# Patient Record
Sex: Male | Born: 1950 | Race: Black or African American | Hispanic: No | Smoking: Former smoker
Health system: Southern US, Community
[De-identification: ages and names within clinical notes are randomized; demographics above are authoritative.]

## PROBLEM LIST (undated history)

## (undated) DIAGNOSIS — I10 Essential (primary) hypertension: Secondary | ICD-10-CM

## (undated) DIAGNOSIS — E669 Obesity, unspecified: Secondary | ICD-10-CM

---

## 2003-12-16 ENCOUNTER — Emergency Department: Payer: Self-pay | Admitting: Emergency Medicine

## 2012-04-10 ENCOUNTER — Ambulatory Visit: Payer: Self-pay | Admitting: Family Medicine

## 2012-04-10 LAB — RAPID INFLUENZA A&B ANTIGENS

## 2013-03-04 ENCOUNTER — Ambulatory Visit: Payer: Self-pay

## 2013-09-16 ENCOUNTER — Ambulatory Visit: Payer: Self-pay | Admitting: Emergency Medicine

## 2015-11-13 ENCOUNTER — Ambulatory Visit
Admission: EM | Admit: 2015-11-13 | Discharge: 2015-11-13 | Disposition: A | Discharge status: Home / Self Care | Payer: BC Managed Care – PPO | Attending: Family Medicine | Admitting: Family Medicine

## 2015-11-13 ENCOUNTER — Encounter: Payer: Self-pay | Admitting: *Deleted

## 2015-11-13 ENCOUNTER — Ambulatory Visit (INDEPENDENT_AMBULATORY_CARE_PROVIDER_SITE_OTHER): Payer: BC Managed Care – PPO

## 2015-11-13 DIAGNOSIS — S93402A Sprain of unspecified ligament of left ankle, initial encounter: Secondary | ICD-10-CM | POA: Diagnosis not present

## 2015-11-13 HISTORY — DX: Essential (primary) hypertension: I10

## 2015-11-13 HISTORY — DX: Obesity, unspecified: E66.9

## 2015-11-13 NOTE — ED Provider Notes (Signed)
[facility] ____________________________________________  Time seen: Approximately 11:34 AM  I have reviewed the triage vital signs and the nursing notes.   HISTORY  Chief Complaint Ankle Pain   HPI Timothy Gutierrez. is a 65 y.o. male presents for the complaints of left medial ankle pain. Patient reports 3 days ago while he was at the gym doing a weighted calf raise exercise he believes he hurt his left ankle. Patient reports that in this exercise he quickly increased the weight resistance and did not gradually increased. Patient states he had minimal discomfort at that time but reports as the night progressed into the next day he then had onset of pain. Patient reports that since this time he has applied ice, elevated and rested it. Patient states pain is dramatically improved. Patient states this time he has minimal left medial ankle pain, and further stated he contemplated coming into the urgent care today. Patient states however he wanted to make sure he did not have a bone abnormality,so that he could get back to exercising and working out at the gym.  Patient reports that he has increased his overall exercise regimen in the last month with approximately 20 pound weight loss. 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 Denies any fall to the ground, direct trauma, head injury or loss of conscious. Denies any numbness or tingling sensation. Denies any pain radiation, other extremity pain or other complaints. Patient reports feels well otherwise. Denies any difficulty walking except for he states that he feels like he is walking more on the outer portion of his left heel to compensate.    Past Medical History:  Diagnosis Date  . Hypertension   . Obesity     There are no active problems to display for this patient.   History reviewed. No pertinent surgical history.  Current Outpatient Rx  . Order #: [id number] Class: Historical Med  . Order #: [id number] Class:  Historical Med    No current facility-administered medications for this encounter.   Current Outpatient Prescriptions:  .  AMLODIPINE BESYLATE PO, Take by mouth., Disp: , Rfl:  .  hydrochlorothiazide (HYDRODIURIL) 25 MG tablet, Take 25 mg by mouth daily., Disp: , Rfl:   Allergies Review of patient's allergies indicates no known allergies.  History reviewed. No pertinent family history.  Social History Social History  Substance Use Topics  . Smoking status: Former Games developer  . Smokeless tobacco: Never Used  . Alcohol use No    Review of Systems Constitutional: No fever/chills Eyes: No visual changes. ENT: No sore throat. Cardiovascular: Denies chest pain. Respiratory: Denies shortness of breath. Gastrointestinal: No abdominal pain.  No nausea, no vomiting.  No diarrhea.  No constipation. Genitourinary: Negative for dysuria. Musculoskeletal: Negative for back pain. Skin: Negative for rash. Neurological: Negative for headaches, focal weakness or numbness.  10-point ROS otherwise negative.  ____________________________________________   PHYSICAL EXAM:  VITAL SIGNS: ED Triage Vitals  Enc Vitals Group     BP 11/13/15 1123 130/81     Pulse Rate 11/13/15 1123 72     Resp 11/13/15 1123 20     Temp 11/13/15 1123 97.7 F (36.5 C)     Temp Source 11/13/15 1123 Oral     SpO2 11/13/15 1123 95 %     Weight 11/13/15 1124 (!) 383 lb (173.7 kg)     Height 11/13/15 1124 6\' 1"  (1.854 m)     Head Circumference --      Peak Flow --  Pain Score 11/13/15 1133 0     Pain Loc --      Pain Edu? --      Excl. in GC? --     Constitutional: Alert and oriented. Well appearing and in no acute distress. Eyes: Conjunctivae are normal. PERRL. EOMI. ENT      Head: Normocephalic and atraumatic.      Nose: No congestion/rhinnorhea.      Mouth/Throat: Mucous membranes are moist. Cardiovascular: Normal rate, regular rhythm. Grossly normal heart sounds.  Good peripheral  circulation. Respiratory: Normal respiratory effort without tachypnea nor retractions. Breath sounds are clear and equal bilaterally. No wheezes/rales/rhonchi.. Musculoskeletal:  Nontender with normal range of motion in all extremities. No midline cervical, thoracic or lumbar tenderness to palpation. Bilateral pedal pulses equal and easily palpated. Except: Left medial ankle minimal swelling, minimal tenderness to direct palpation, left ankle full range of motion, left foot full range of motion, no pain with ankle rotation or plantar flexion or dorsiflexion of left foot, normal distal capillary refill and sensation, no motor or tendon deficit, no erythema, no ecchymosis, skin intact. Left lower extremity otherwise nontender. No calf tenderness bilaterally. No Achilles tenderness and with strong plantar and dorsiflexion to left lower extremity.   Neurologic:  Normal speech and language. No gross focal neurologic deficits are appreciated. Speech is normal. No gait instability.  Skin:  Skin is warm, dry and intact. No rash noted. Psychiatric: Mood and affect are normal. Speech and behavior are normal. Patient exhibits appropriate insight and judgment   ___________________________________________   LABS (all labs ordered are listed, but only abnormal results are displayed)  Labs Reviewed - No data to display  RADIOLOGY  Dg Ankle Complete Left  Result Date: 11/13/2015 CLINICAL DATA:  Recent trauma.  Pain. EXAM: LEFT ANKLE COMPLETE - 3+ VIEW COMPARISON:  None. FINDINGS: No fracture or dislocation. A moderate-sized plantar spur is identified. Medial soft tissue swelling is noted. IMPRESSION: Medial soft tissue swelling.  No other abnormalities. Electronically Signed   By: Gerome Samavid  Williams III M.D   On: 11/13/2015 12:09   ____________________________________________   PROCEDURES Procedures   Left ankle stirrup Velcro splint applied by RN. Neurovascular intact. Patient denies need for crutches or  walker.  INITIAL IMPRESSION / ASSESSMENT AND PLAN / ED COURSE  Pertinent labs & imaging results that were available during my care of the patient were reviewed by me and considered in my medical decision making (see chart for details).  Very well-appearing patient. No acute distress. Presents for complaints of left medial ankle pain in which she feels that he injured it while doing Exercises at the gym. Suspect strain and sprain injury. Left ankle x-ray medial soft tissue swelling, no other abnormalities per radiologist. Encouraged supportive care, rest, ice, elevation. Patient denies need for prescription medication. Encouraged patient to gradually increase activity to left foot and ankle as tolerated.  Discussed follow up with Primary care physician this week. Discussed follow up and return parameters including no resolution or any worsening concerns. Patient verbalized understanding and agreed to plan.   ____________________________________________   FINAL CLINICAL IMPRESSION(S) / ED DIAGNOSES  Final diagnoses:  Sprain of left ankle, unspecified ligament, initial encounter     Discharge Medication List as of 11/13/2015 12:17 PM      Note: This dictation was prepared with Dragon dictation along with smaller phrase technology. Any transcriptional errors that result from this process are unintentional.    Clinical Course      Renford DillsLindsey Ambreen Tufte, NP 11/13/15  2159  

## 2015-11-13 NOTE — ED Triage Notes (Signed)
Patient injured his ankle 3 days ago while he was exercising.  Patient has no  History of injuring his left ankle

## 2015-11-13 NOTE — Discharge Instructions (Signed)
Rest, ice and elevate as discussed.   Follow up with your primary care physician this week as needed. Return to Urgent care for new or worsening concerns.

## 2017-05-26 IMAGING — CR DG ANKLE COMPLETE 3+V*L*
3 series · 3 of 3 positions shown · non-contrast
Comparison: None.

CLINICAL DATA: Recent trauma.  Pain.

EXAM:
LEFT ANKLE COMPLETE - 3+ VIEW

[ankle ap]
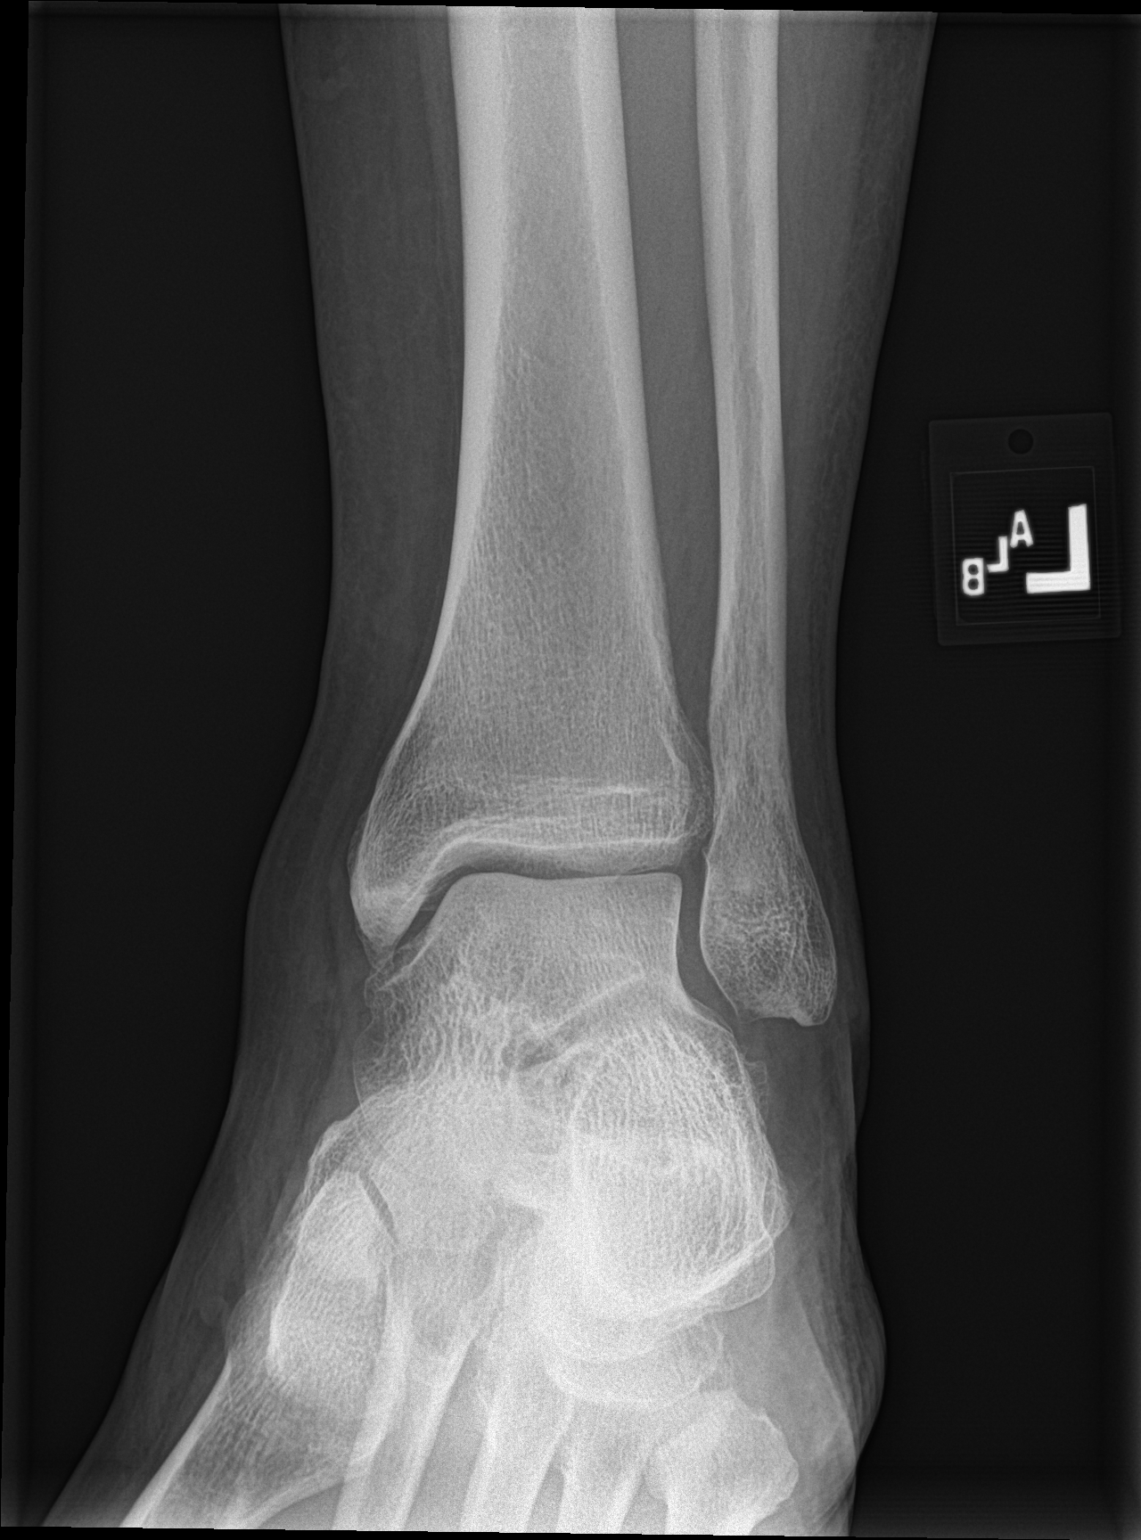

[ankle obl]
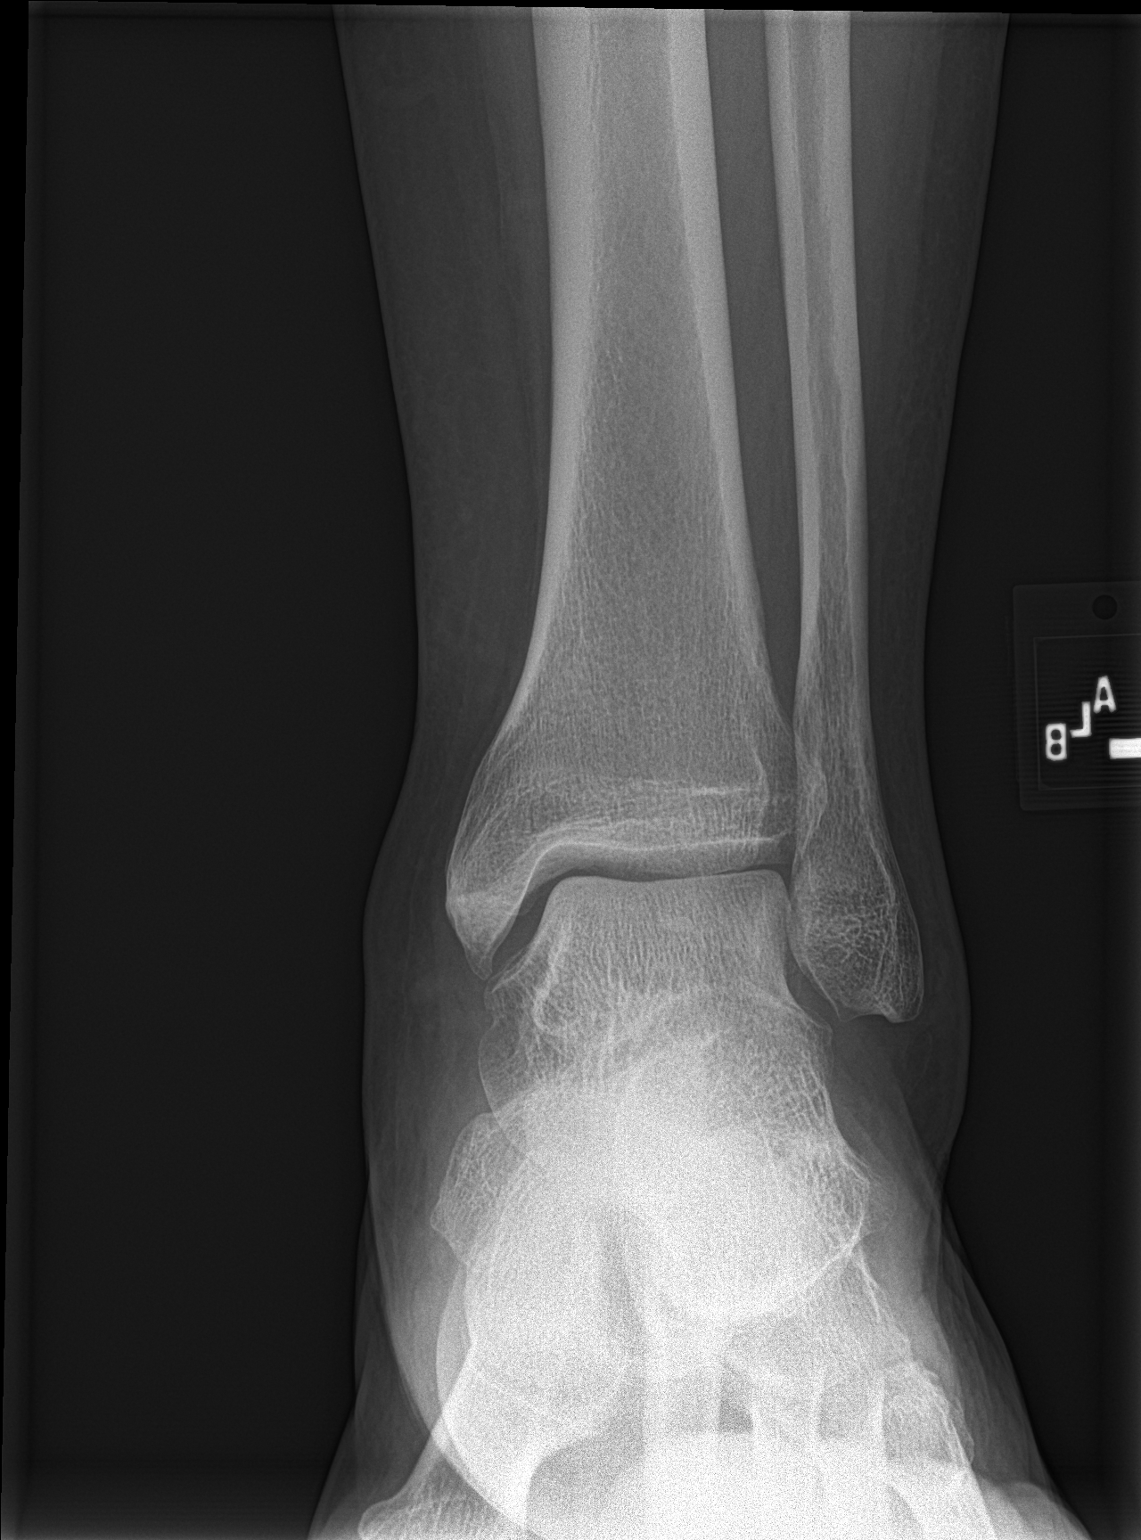

[ankle lat]
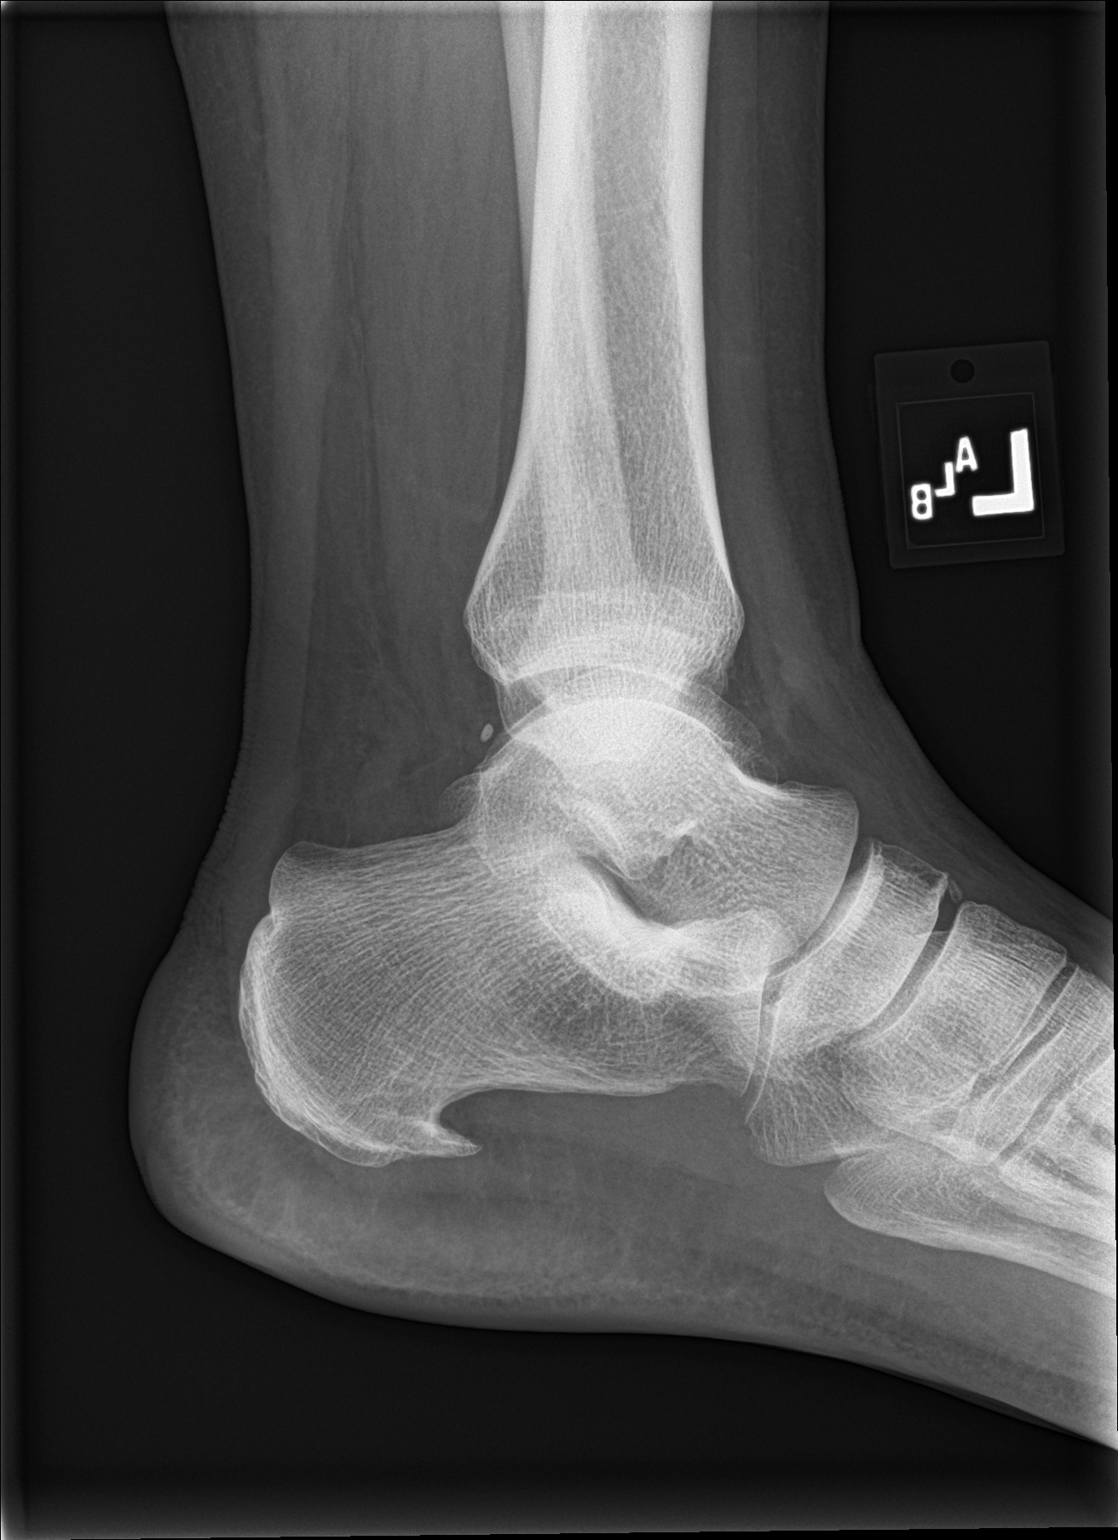

[3 of 3 positions shown; findings below may reference images not displayed]

FINDINGS: No fracture or dislocation. A moderate-sized plantar spur is
identified. Medial soft tissue swelling is noted.
IMPRESSION: Medial soft tissue swelling.  No other abnormalities.

## 2017-12-16 ENCOUNTER — Other Ambulatory Visit: Payer: Self-pay

## 2017-12-16 ENCOUNTER — Ambulatory Visit
Admission: EM | Admit: 2017-12-16 | Discharge: 2017-12-16 | Disposition: A | Discharge status: Home / Self Care | Payer: BC Managed Care – PPO | Attending: Family Medicine | Admitting: Family Medicine

## 2017-12-16 DIAGNOSIS — R112 Nausea with vomiting, unspecified: Secondary | ICD-10-CM

## 2017-12-16 DIAGNOSIS — R197 Diarrhea, unspecified: Secondary | ICD-10-CM

## 2017-12-16 DIAGNOSIS — R1031 Right lower quadrant pain: Secondary | ICD-10-CM | POA: Diagnosis not present

## 2017-12-16 MED ORDER — ONDANSETRON 8 MG PO TBDP
8.0000 mg | ORAL_TABLET | Freq: Once | ORAL | Status: AC
  Administered 2017-12-16: 8 mg via ORAL

## 2017-12-16 MED ORDER — ONDANSETRON 4 MG PO TBDP: 4.0000 mg | ORAL_TABLET | Freq: Three times a day (TID) | ORAL | 0 refills | Status: DC | PRN

## 2017-12-16 NOTE — Discharge Instructions (Signed)
Rest, fluids.  Use the nausea medication as needed.  If worsens, go to the hospital (you'll need a CT scan).  Take care  Dr. Adriana Simasook

## 2017-12-16 NOTE — ED Triage Notes (Signed)
Pt started this a.m. With n/v/d and pain in his right groin or RLQ. Last night he had egg white with spinach and [place] sausage at 10 p.m. Pain 6/10

## 2017-12-16 NOTE — ED Provider Notes (Signed)
[facility]    CSN: [id number] Arrival date & time: 12/16/17  1058  History   Chief Complaint Chief Complaint  Patient presents with  . Diarrhea  . Nausea   HPI  67 year old male presents with nausea, vomiting, diarrhea, and right lower quadrant pain.  Patient reports that his symptoms started abruptly this morning.  Pain started first and he developed nausea, vomiting, diarrhea.  Patient states that his abdomen is not tender as pain particularly when he has nausea vomiting and diarrhea.  He is unsure if what he ate last night was infected.  His pain is mild to moderate currently.  No fever.  No chills.  No medications or interventions tried.  No other associated symptoms.  Hx reviewed as below. Past Medical History:  Diagnosis Date  . Hypertension   . Obesity    History reviewed. No pertinent surgical history.   Home Medications    Prior to Admission medications   Medication Sig Start Date End Date Taking? Authorizing Provider  AMLODIPINE BESYLATE PO Take by mouth.    [provider]  hydrochlorothiazide (HYDRODIURIL) 25 MG tablet Take 25 mg by mouth daily.    [provider]  ondansetron (ZOFRAN-ODT) 4 MG disintegrating tablet Take 1 tablet (4 mg total) by mouth every 8 (eight) hours as needed for nausea or vomiting. 12/16/17   Tommie Samsook, Satara Virella G, DO   Social History Social History   Tobacco Use  . Smoking status: Former Games developermoker  . Smokeless tobacco: Never Used  Substance Use Topics  . Alcohol use: No  . Drug use: No   Allergies   Patient has no known allergies.  Review of Systems Review of Systems  Constitutional: Negative for fever.  Gastrointestinal: Positive for abdominal pain, diarrhea, nausea and vomiting.   Physical Exam Triage Vital Signs ED Triage Vitals  Enc Vitals Group     BP 12/16/17 1116 136/69     Pulse Rate 12/16/17 1116 73     Resp 12/16/17 1116 20     Temp 12/16/17 1116 97.6 F (36.4 C)     Temp Source  12/16/17 1116 Oral     SpO2 12/16/17 1116 98 %     Weight 12/16/17 1113 (!) 387 lb (175.5 kg)     Height 12/16/17 1113 6\' 1"  (1.854 m)     Head Circumference --      Peak Flow --      Pain Score 12/16/17 1113 6     Pain Loc --      Pain Edu? --      Excl. in GC? --    Updated Vital Signs BP 136/69 (BP Location: Right Arm)   Pulse 73   Temp 97.6 F (36.4 C) (Oral)   Resp 20   Ht 6\' 1"  (1.854 m)   Wt (!) 175.5 kg   SpO2 98%   BMI 51.06 kg/m   Visual Acuity Right Eye Distance:   Left Eye Distance:   Bilateral Distance:    Right Eye Near:   Left Eye Near:    Bilateral Near:     Physical Exam  Constitutional: He is oriented to person, place, and time. He appears well-developed. No distress.  HENT:  Head: Normocephalic and atraumatic.  Eyes: Conjunctivae are normal. Right eye exhibits no discharge. Left eye exhibits no discharge.  Cardiovascular: Normal rate and regular rhythm.  Pulmonary/Chest: Effort normal and breath sounds normal.  Abdominal:  Obese.  Soft, nondistended.  No tenderness on exam.  No  evidence of acute abdomen.  Neurological: He is alert and oriented to person, place, and time.  Psychiatric: He has a normal mood and affect. His behavior is normal.  Nursing note and vitals reviewed.  UC Treatments / Results  Labs (all labs ordered are listed, but only abnormal results are displayed) Labs Reviewed - No data to display  EKG None  Radiology No results found.  Procedures Procedures (including critical care time)  Medications Ordered in UC Medications  ondansetron (ZOFRAN-ODT) disintegrating tablet 8 mg (8 mg Oral Given 12/16/17 1136)    Initial Impression / Assessment and Plan / UC Course  I have reviewed the triage vital signs and the nursing notes.  Pertinent labs & imaging results that were available during my care of the patient were reviewed by me and considered in my medical decision making (see chart for details).    67 year old male  presents with nausea, vomiting, diarrhea, associated right lower quadrant pain.  Does not appear to be consistent with sinusitis.  Likely related to food that he ate last night or viral gastroenteritis.  Zofran given.  Sending home on Zofran.  Push fluids.  If fails to improve or worsens, will need to go to the hospital.  Final Clinical Impressions(s) / UC Diagnoses   Final diagnoses:  Nausea vomiting and diarrhea     Discharge Instructions     Rest, fluids.  Use the nausea medication as needed.  If worsens, go to the hospital (you'll need a CT scan).  Take care  Dr. Adriana Simas    ED Prescriptions    Medication Sig Dispense Auth. Provider   ondansetron (ZOFRAN-ODT) 4 MG disintegrating tablet Take 1 tablet (4 mg total) by mouth every 8 (eight) hours as needed for nausea or vomiting. 20 tablet Tommie Sams, DO     Controlled Substance Prescriptions [place] Controlled Substance Registry consulted? Not Applicable   Tommie Sams, DO 12/16/17 1238

## 2018-01-22 ENCOUNTER — Ambulatory Visit (INDEPENDENT_AMBULATORY_CARE_PROVIDER_SITE_OTHER): Payer: BC Managed Care – PPO | Admitting: Podiatry

## 2018-01-22 ENCOUNTER — Encounter: Payer: Self-pay | Admitting: Podiatry

## 2018-01-22 VITALS — BP 180/93 | HR 78

## 2018-01-22 DIAGNOSIS — M79674 Pain in right toe(s): Secondary | ICD-10-CM | POA: Diagnosis not present

## 2018-01-22 DIAGNOSIS — M79675 Pain in left toe(s): Secondary | ICD-10-CM

## 2018-01-22 DIAGNOSIS — B351 Tinea unguium: Secondary | ICD-10-CM

## 2018-01-22 NOTE — Progress Notes (Signed)
Complaint:  Visit Type: Patient presents  to my office for  preventative foot care services. Complaint: Patient states" my nails have grown long and thick and become painful to walk and wear shoes" Patient has been diagnosed with prediabetes  with no foot complications. The patient presents for preventative foot care services. No changes to ROS  Podiatric Exam: Vascular: dorsalis pedis and posterior tibial pulses are palpable bilateral. Capillary return is immediate. Temperature gradient is WNL. Skin turgor WNL. Sensorium: Normal Semmes Weinstein monofilament test. Normal tactile sensation bilaterally. Nail Exam: Pt has thick disfigured discolored nails with subungual debris noted bilateral entire nail hallux through fifth toenails Ulcer Exam: There is no evidence of ulcer or pre-ulcerative changes or infection. Orthopedic Exam: Muscle tone and strength are WNL. No limitations in general ROM. No crepitus or effusions noted. Foot type and digits show no abnormalities. Bony prominences are unremarkable. Skin: No Porokeratosis. No infection or ulcers  Diagnosis:  Onychomycosis, , Pain in right toe, pain in left toes  Treatment & Plan Procedures and Treatment: Consent by patient was obtained for treatment procedures.   Debridement of mycotic and hypertrophic toenails, 1 through 5 bilateral and clearing of subungual debris. No ulceration, no infection noted.  Return Visit-Office Procedure: Patient instructed to return to the office for a follow up visit 3 months for continued evaluation and treatment.    Kunio Cummiskey DPM 

## 2018-04-26 ENCOUNTER — Ambulatory Visit: Payer: BC Managed Care – PPO | Admitting: Podiatry

## 2018-05-31 ENCOUNTER — Ambulatory Visit: Payer: BC Managed Care – PPO | Admitting: Podiatry

## 2018-06-07 ENCOUNTER — Other Ambulatory Visit: Payer: Self-pay

## 2018-06-07 ENCOUNTER — Encounter: Payer: Self-pay | Admitting: Podiatry

## 2018-06-07 ENCOUNTER — Ambulatory Visit: Payer: BC Managed Care – PPO | Admitting: Podiatry

## 2018-06-07 VITALS — Temp 98.0°F

## 2018-06-07 DIAGNOSIS — M79675 Pain in left toe(s): Secondary | ICD-10-CM | POA: Diagnosis not present

## 2018-06-07 DIAGNOSIS — M79674 Pain in right toe(s): Secondary | ICD-10-CM | POA: Diagnosis not present

## 2018-06-07 DIAGNOSIS — B351 Tinea unguium: Secondary | ICD-10-CM

## 2018-06-07 NOTE — Progress Notes (Signed)
Complaint:  Visit Type: Patient returns to my office for continued preventative foot care services. Complaint: Patient states" my nails have grown long and thick and become painful to walk and wear shoes" Patient has been diagnosed with DM with no foot complications. The patient presents for preventative foot care services. No changes to ROS  Podiatric Exam: Vascular: dorsalis pedis and posterior tibial pulses are palpable bilateral. Capillary return is immediate. Temperature gradient is WNL. Skin turgor WNL  Sensorium: Normal Semmes Weinstein monofilament test. Normal tactile sensation bilaterally. Nail Exam: Pt has thick disfigured discolored nails with subungual debris noted bilateral entire nail hallux through fifth toenails Ulcer Exam: There is no evidence of ulcer or pre-ulcerative changes or infection. Orthopedic Exam: Muscle tone and strength are WNL. No limitations in general ROM. No crepitus or effusions noted. Foot type and digits show no abnormalities. Bony prominences are unremarkable. Skin: No Porokeratosis. No infection or ulcers  Diagnosis:  Onychomycosis, , Pain in right toe, pain in left toes  Treatment & Plan Procedures and Treatment: Consent by patient was obtained for treatment procedures.   Debridement of mycotic and hypertrophic toenails, 1 through 5 bilateral and clearing of subungual debris. No ulceration, no infection noted.  Return Visit-Office Procedure: Patient instructed to return to the office for a follow up visit 3 months for continued evaluation and treatment.    Jodine Muchmore DPM 

## 2018-09-06 ENCOUNTER — Encounter: Payer: Self-pay | Admitting: Podiatry

## 2018-09-06 ENCOUNTER — Other Ambulatory Visit: Payer: Self-pay

## 2018-09-06 ENCOUNTER — Ambulatory Visit: Payer: BC Managed Care – PPO | Admitting: Podiatry

## 2018-09-06 VITALS — Temp 97.2°F

## 2018-09-06 DIAGNOSIS — M79674 Pain in right toe(s): Secondary | ICD-10-CM | POA: Diagnosis not present

## 2018-09-06 DIAGNOSIS — M79675 Pain in left toe(s): Secondary | ICD-10-CM | POA: Diagnosis not present

## 2018-09-06 DIAGNOSIS — B351 Tinea unguium: Secondary | ICD-10-CM

## 2018-09-06 NOTE — Progress Notes (Signed)
Complaint:  Visit Type: Patient returns to my office for continued preventative foot care services. Complaint: Patient states" my nails have grown long and thick and become painful to walk and wear shoes" Patient has been diagnosed with DM with no foot complications. The patient presents for preventative foot care services. No changes to ROS  Podiatric Exam: Vascular: dorsalis pedis and posterior tibial pulses are palpable bilateral. Capillary return is immediate. Temperature gradient is WNL. Skin turgor WNL  Sensorium: Normal Semmes Weinstein monofilament test. Normal tactile sensation bilaterally. Nail Exam: Pt has thick disfigured discolored nails with subungual debris noted bilateral entire nail hallux through fifth toenails Ulcer Exam: There is no evidence of ulcer or pre-ulcerative changes or infection. Orthopedic Exam: Muscle tone and strength are WNL. No limitations in general ROM. No crepitus or effusions noted. Foot type and digits show no abnormalities. Bony prominences are unremarkable. Skin: No Porokeratosis. No infection or ulcers  Diagnosis:  Onychomycosis, , Pain in right toe, pain in left toes  Treatment & Plan Procedures and Treatment: Consent by patient was obtained for treatment procedures.   Debridement of mycotic and hypertrophic toenails, 1 through 5 bilateral and clearing of subungual debris. No ulceration, no infection noted.  Return Visit-Office Procedure: Patient instructed to return to the office for a follow up visit 3 months for continued evaluation and treatment.    Raja Caputi DPM 

## 2018-12-06 ENCOUNTER — Encounter: Payer: Self-pay | Admitting: Podiatry

## 2018-12-06 ENCOUNTER — Ambulatory Visit: Payer: BC Managed Care – PPO | Admitting: Podiatry

## 2018-12-06 ENCOUNTER — Other Ambulatory Visit: Payer: Self-pay

## 2018-12-06 DIAGNOSIS — M79675 Pain in left toe(s): Secondary | ICD-10-CM

## 2018-12-06 DIAGNOSIS — M79674 Pain in right toe(s): Secondary | ICD-10-CM

## 2018-12-06 DIAGNOSIS — B351 Tinea unguium: Secondary | ICD-10-CM | POA: Diagnosis not present

## 2018-12-06 NOTE — Progress Notes (Signed)
Complaint:  Visit Type: Patient presents  to my office for  preventative foot care services. Complaint: Patient states" my nails have grown long and thick and become painful to walk and wear shoes" Patient has been diagnosed with prediabetes  with no foot complications. The patient presents for preventative foot care services. No changes to ROS  Podiatric Exam: Vascular: dorsalis pedis and posterior tibial pulses are palpable bilateral. Capillary return is immediate. Temperature gradient is WNL. Skin turgor WNL. Sensorium: Normal Semmes Weinstein monofilament test. Normal tactile sensation bilaterally. Nail Exam: Pt has thick disfigured discolored nails with subungual debris noted bilateral entire nail hallux through fifth toenails Ulcer Exam: There is no evidence of ulcer or pre-ulcerative changes or infection. Orthopedic Exam: Muscle tone and strength are WNL. No limitations in general ROM. No crepitus or effusions noted. Foot type and digits show no abnormalities. Bony prominences are unremarkable. Skin: No Porokeratosis. No infection or ulcers  Diagnosis:  Onychomycosis, , Pain in right toe, pain in left toes  Treatment & Plan Procedures and Treatment: Consent by patient was obtained for treatment procedures.   Debridement of mycotic and hypertrophic toenails, 1 through 5 bilateral and clearing of subungual debris. No ulceration, no infection noted.  Return Visit-Office Procedure: Patient instructed to return to the office for a follow up visit 3 months for continued evaluation and treatment.    Myiesha Edgar DPM 

## 2019-02-14 ENCOUNTER — Other Ambulatory Visit: Payer: Self-pay

## 2019-02-14 ENCOUNTER — Encounter: Payer: Self-pay | Admitting: Podiatry

## 2019-02-14 ENCOUNTER — Ambulatory Visit: Payer: BC Managed Care – PPO | Admitting: Podiatry

## 2019-02-14 DIAGNOSIS — M79675 Pain in left toe(s): Secondary | ICD-10-CM | POA: Diagnosis not present

## 2019-02-14 DIAGNOSIS — M79674 Pain in right toe(s): Secondary | ICD-10-CM

## 2019-02-14 DIAGNOSIS — B351 Tinea unguium: Secondary | ICD-10-CM | POA: Diagnosis not present

## 2019-02-14 NOTE — Progress Notes (Signed)
Complaint:  Visit Type: Patient presents  to my office for  preventative foot care services. Complaint: Patient states" my nails have grown long and thick and become painful to walk and wear shoes" Patient has been diagnosed with prediabetes  with no foot complications. The patient presents for preventative foot care services. No changes to ROS  Podiatric Exam: Vascular: dorsalis pedis and posterior tibial pulses are palpable bilateral. Capillary return is immediate. Temperature gradient is WNL. Skin turgor WNL. Sensorium: Normal Semmes Weinstein monofilament test. Normal tactile sensation bilaterally. Nail Exam: Pt has thick disfigured discolored nails with subungual debris noted bilateral entire nail hallux through fifth toenails Ulcer Exam: There is no evidence of ulcer or pre-ulcerative changes or infection. Orthopedic Exam: Muscle tone and strength are WNL. No limitations in general ROM. No crepitus or effusions noted. Foot type and digits show no abnormalities. Bony prominences are unremarkable. Skin: No Porokeratosis. No infection or ulcers  Diagnosis:  Onychomycosis, , Pain in right toe, pain in left toes  Treatment & Plan Procedures and Treatment: Consent by patient was obtained for treatment procedures.   Debridement of mycotic and hypertrophic toenails, 1 through 5 bilateral and clearing of subungual debris. No ulceration, no infection noted.  Return Visit-Office Procedure: Patient instructed to return to the office for a follow up visit 3 months for continued evaluation and treatment.    Helane Gunther DPM

## 2019-05-16 ENCOUNTER — Ambulatory Visit: Payer: BC Managed Care – PPO | Admitting: Podiatry

## 2019-06-06 ENCOUNTER — Encounter: Payer: Self-pay | Admitting: Emergency Medicine

## 2019-06-06 ENCOUNTER — Ambulatory Visit
Admission: EM | Admit: 2019-06-06 | Discharge: 2019-06-06 | Disposition: A | Discharge status: Home / Self Care | Payer: BC Managed Care – PPO | Attending: Family Medicine | Admitting: Family Medicine

## 2019-06-06 ENCOUNTER — Other Ambulatory Visit: Payer: Self-pay

## 2019-06-06 DIAGNOSIS — N390 Urinary tract infection, site not specified: Secondary | ICD-10-CM

## 2019-06-06 LAB — BASIC METABOLIC PANEL
Anion gap: 7 (ref 5–15)
BUN: 23 mg/dL (ref 8–23)
CO2: 28 mmol/L (ref 22–32)
Calcium: 9.3 mg/dL (ref 8.9–10.3)
Chloride: 106 mmol/L (ref 98–111)
Creatinine, Ser: 0.84 mg/dL (ref 0.61–1.24)
GFR calc Af Amer: >60 mL/min (ref >60)
GFR calc non Af Amer: >60 mL/min (ref >60)
Glucose, Bld: 120 mg/dL — ABNORMAL HIGH (ref 70–99)
Potassium: 4 mmol/L (ref 3.5–5.1)
Sodium: 141 mmol/L (ref 135–145)

## 2019-06-06 LAB — URINALYSIS, COMPLETE (UACMP) WITH MICROSCOPIC
Bilirubin Urine: NEGATIVE
Glucose, UA: NEGATIVE mg/dL
Hgb urine dipstick: NEGATIVE
Ketones, ur: NEGATIVE mg/dL
Nitrite: NEGATIVE
Protein, ur: NEGATIVE mg/dL
Specific Gravity, Urine: 1.025 (ref 1.005–1.030)
WBC, UA: >50 WBC/hpf (ref 0–5)
pH: 5.5 (ref 5.0–8.0)

## 2019-06-06 MED ORDER — CEFDINIR 300 MG PO CAPS: 300.0000 mg | ORAL_CAPSULE | Freq: Two times a day (BID) | ORAL | 0 refills | Status: DC

## 2019-06-06 NOTE — ED Triage Notes (Signed)
Patient c/o fever that started on Sunday. He reports headache, generalized body aches, urinary frequency and chills that started on Monday. Patient was tested for COVID on Tuesday at [facility] and he was negative. His fever has resolved but he continues to have urinary frequency and urgency.

## 2019-06-06 NOTE — ED Provider Notes (Signed)
[facility]    CSN: [id number] Arrival date & time: 06/06/19  1140      History   Chief Complaint Chief Complaint  Patient presents with  . Urinary Frequency  . Urinary Urgency   HPI  69 year old male presents with concern for UTI.  Patient reports that he had a fever on Sunday, T-max 100.9-101.  Patient reports that he had a headache, body aches, as well as urinary frequency/urgency and chills.  Symptoms have resolved except for urinary symptoms.  He has recently been tested for Covid and was negative.  Patient reports that he continues to have urinary complaints.  He reports urinary urgency/frequency.  No fever.  No dysuria.  No relieving factors.  No other complaints.  Past Medical History:  Diagnosis Date  . Hypertension   . Obesity    Home Medications    Prior to Admission medications   Medication Sig Start Date End Date Taking? Authorizing Provider  amLODipine (NORVASC) 10 MG tablet  06/26/18  Yes [provider]  hydrochlorothiazide (HYDRODIURIL) 25 MG tablet Take 25 mg by mouth daily.   Yes [provider]  AMLODIPINE BESYLATE PO Take by mouth.    [provider]  cefdinir (OMNICEF) 300 MG capsule Take 1 capsule (300 mg total) by mouth 2 (two) times daily. 06/06/19   Tommie Sams, DO  tamsulosin (FLOMAX) 0.4 MG CAPS capsule  12/18/17   [provider]   Social History Social History   Tobacco Use  . Smoking status: Former Games developer  . Smokeless tobacco: Never Used  Substance Use Topics  . Alcohol use: No  . Drug use: No     Allergies   Patient has no known allergies.   Review of Systems Review of Systems Per HPI  Physical Exam Triage Vital Signs ED Triage Vitals  Enc Vitals Group     BP 06/06/19 1222 138/78     Pulse Rate 06/06/19 1222 71     Resp 06/06/19 1222 18     Temp 06/06/19 1222 98 F (36.7 C)     Temp Source 06/06/19 1222 Oral     SpO2 06/06/19 1222 97 %     Weight 06/06/19 1221 (!) 380 lb  (172.4 kg)     Height 06/06/19 1221 6\' 1"  (1.854 m)     Head Circumference --      Peak Flow --      Pain Score 06/06/19 1220 0     Pain Loc --      Pain Edu? --      Excl. in GC? --    Updated Vital Signs BP 138/78 (BP Location: Right Arm)   Pulse 71   Temp 98 F (36.7 C) (Oral)   Resp 18   Ht 6\' 1"  (1.854 m)   Wt (!) 172.4 kg   SpO2 97%   BMI 50.13 kg/m   Visual Acuity Right Eye Distance:   Left Eye Distance:   Bilateral Distance:    Right Eye Near:   Left Eye Near:    Bilateral Near:     Physical Exam Vitals and nursing note reviewed.  Constitutional:      General: He is not in acute distress.    Appearance: Normal appearance. He is obese. He is not ill-appearing.  HENT:     Head: Normocephalic and atraumatic.  Eyes:     General:        Right eye: No discharge.  Left eye: No discharge.     Conjunctiva/sclera: Conjunctivae normal.  Cardiovascular:     Rate and Rhythm: Normal rate and regular rhythm.  Pulmonary:     Effort: Pulmonary effort is normal. No respiratory distress.  Abdominal:     General: There is no distension.     Palpations: Abdomen is soft.     Tenderness: There is no abdominal tenderness.  Neurological:     Mental Status: He is alert.  Psychiatric:        Mood and Affect: Mood normal.        Behavior: Behavior normal.    UC Treatments / Results  Labs (all labs ordered are listed, but only abnormal results are displayed) Labs Reviewed  URINALYSIS, COMPLETE (UACMP) WITH MICROSCOPIC - Abnormal; Notable for the following components:      Result Value   APPearance HAZY (*)    Leukocytes,Ua SMALL (*)    Bacteria, UA MANY (*)    All other components within normal limits  BASIC METABOLIC PANEL - Abnormal; Notable for the following components:   Glucose, Bld 120 (*)    All other components within normal limits  URINE CULTURE    EKG   Radiology No results found.  Procedures Procedures (including critical care time)   Medications Ordered in UC Medications - No data to display  Initial Impression / Assessment and Plan / UC Course  I have reviewed the triage vital signs and the nursing notes.  Pertinent labs & imaging results that were available during my care of the patient were reviewed by me and considered in my medical decision making (see chart for details).    69 year old male presents with UTI.  Sending culture.  Placing on Omnicef.  Final Clinical Impressions(s) / UC Diagnoses   Final diagnoses:  Urinary tract infection without hematuria, site unspecified     Discharge Instructions     Medication as prescribed.  Take care  Dr. Lacinda Axon    ED Prescriptions    Medication Sig Dispense Auth. Provider   cefdinir (OMNICEF) 300 MG capsule Take 1 capsule (300 mg total) by mouth 2 (two) times daily. 20 capsule Coral Spikes, DO     PDMP not reviewed this encounter.   Coral Spikes, [place] 06/06/19 1535

## 2019-06-06 NOTE — Discharge Instructions (Signed)
Medication as prescribed.  Take care  Dr. Laelah Siravo  

## 2019-06-08 LAB — URINE CULTURE: Culture: >=100000 — AB

## 2019-12-07 ENCOUNTER — Other Ambulatory Visit: Payer: Self-pay

## 2019-12-07 ENCOUNTER — Ambulatory Visit
Admission: EM | Admit: 2019-12-07 | Discharge: 2019-12-07 | Disposition: A | Discharge status: Home / Self Care | Payer: BC Managed Care – PPO | Attending: Family Medicine | Admitting: Family Medicine

## 2019-12-07 DIAGNOSIS — N3000 Acute cystitis without hematuria: Secondary | ICD-10-CM | POA: Diagnosis not present

## 2019-12-07 LAB — URINALYSIS, COMPLETE (UACMP) WITH MICROSCOPIC
Bilirubin Urine: NEGATIVE
Glucose, UA: NEGATIVE mg/dL
Hgb urine dipstick: NEGATIVE
Ketones, ur: NEGATIVE mg/dL
Nitrite: NEGATIVE
Protein, ur: NEGATIVE mg/dL
Specific Gravity, Urine: 1.025 (ref 1.005–1.030)
pH: 5.5 (ref 5.0–8.0)

## 2019-12-07 MED ORDER — CEPHALEXIN 500 MG PO CAPS: 500.0000 mg | ORAL_CAPSULE | Freq: Two times a day (BID) | ORAL | 0 refills | Status: DC

## 2019-12-07 NOTE — ED Provider Notes (Signed)
[facility]    CSN: [id number] Arrival date & time: 12/07/19  1413      History   Chief Complaint Chief Complaint  Patient presents with  . Urinary Frequency   HPI   69 year old male presents with urinary frequency.   He has had symptoms over the past week. Frequent urination and urgency. Has had trouble making it to the restroom on time. No abdominal pain, hematuria, nausea , vomiting, flank pain. No fever. No relieving factors. No known exacerbating factors.   Past Medical History:  Diagnosis Date  . Hypertension   . Obesity     Home Medications    Prior to Admission medications   Medication Sig Start Date End Date Taking? Authorizing Provider  amLODipine (NORVASC) 10 MG tablet  06/26/18  Yes [provider]  hydrochlorothiazide (HYDRODIURIL) 25 MG tablet Take 25 mg by mouth daily.   Yes [provider]  cephALEXin (KEFLEX) 500 MG capsule Take 1 capsule (500 mg total) by mouth 2 (two) times daily. 12/07/19   Tommie Sams, DO   Social History Social History   Tobacco Use  . Smoking status: Former Games developer  . Smokeless tobacco: Never Used  Substance Use Topics  . Alcohol use: No  . Drug use: No     Allergies   Patient has no known allergies.   Review of Systems Review of Systems  Constitutional: Negative.   Genitourinary: Positive for frequency and urgency.   Physical Exam Triage Vital Signs ED Triage Vitals  Enc Vitals Group     BP 12/07/19 1429 140/86     Pulse Rate 12/07/19 1429 73     Resp 12/07/19 1429 18     Temp 12/07/19 1429 98 F (36.7 C)     Temp Source 12/07/19 1429 Oral     SpO2 12/07/19 1429 97 %     Weight 12/07/19 1427 (!) 367 lb (166.5 kg)     Height 12/07/19 1427 6\' 1"  (1.854 m)     Head Circumference --      Peak Flow --      Pain Score 12/07/19 1427 0     Pain Loc --      Pain Edu? --      Excl. in GC? --    Updated Vital Signs BP 140/86 (BP Location: Left Arm)   Pulse 73   Temp 98 F (36.7  C) (Oral)   Resp 18   Ht 6\' 1"  (1.854 m)   Wt (!) 166.5 kg   SpO2 97%   BMI 48.42 kg/m   Visual Acuity Right Eye Distance:   Left Eye Distance:   Bilateral Distance:    Right Eye Near:   Left Eye Near:    Bilateral Near:     Physical Exam Constitutional:      General: He is not in acute distress.    Appearance: Normal appearance. He is obese. He is not ill-appearing.  HENT:     Head: Normocephalic and atraumatic.  Cardiovascular:     Rate and Rhythm: Normal rate and regular rhythm.  Pulmonary:     Effort: Pulmonary effort is normal.     Breath sounds: Normal breath sounds. No wheezing or rales.  Abdominal:     General: There is no distension.     Palpations: Abdomen is soft.     Tenderness: There is no abdominal tenderness.  Neurological:     Mental Status: He is alert.  Psychiatric:  Mood and Affect: Mood normal.        Behavior: Behavior normal.    UC Treatments / Results  Labs (all labs ordered are listed, but only abnormal results are displayed) Labs Reviewed  URINALYSIS, COMPLETE (UACMP) WITH MICROSCOPIC - Abnormal; Notable for the following components:      Result Value   Leukocytes,Ua SMALL (*)    Bacteria, UA MANY (*)    All other components within normal limits  URINE CULTURE    EKG   Radiology No results found.  Procedures Procedures (including critical care time)  Medications Ordered in UC Medications - No data to display  Initial Impression / Assessment and Plan / UC Course  I have reviewed the triage vital signs and the nursing notes.  Pertinent labs & imaging results that were available during my care of the patient were reviewed by me and considered in my medical decision making (see chart for details).    69 year old male presents with UTI. Awaiting culture. Treating with Keflex.   Final Clinical Impressions(s) / UC Diagnoses   Final diagnoses:  Acute cystitis without hematuria     Discharge Instructions      Waiting on culture results.  Antibiotic as prescribed.  Take care  Dr. Adriana Simas    ED Prescriptions    Medication Sig Dispense Auth. Provider   cephALEXin (KEFLEX) 500 MG capsule Take 1 capsule (500 mg total) by mouth 2 (two) times daily. 14 capsule Everlene Other G, DO     PDMP not reviewed this encounter.   Tommie Sams, [place] 12/07/19 1827

## 2019-12-07 NOTE — Discharge Instructions (Addendum)
Waiting on culture results.  Antibiotic as prescribed.  Take care  Dr. Adriana Simas

## 2019-12-07 NOTE — ED Triage Notes (Signed)
Pt presents with c/o urinary frequency over the past week. Pt denies any other symptoms such as hematuria, abd pain, f/n/v/d.

## 2019-12-10 LAB — URINE CULTURE: Culture: 80000 — AB

## 2022-01-22 ENCOUNTER — Ambulatory Visit
Admission: EM | Admit: 2022-01-22 | Discharge: 2022-01-22 | Disposition: A | Discharge status: Home / Self Care | Payer: BC Managed Care – PPO | Attending: Internal Medicine | Admitting: Internal Medicine

## 2022-01-22 DIAGNOSIS — Z1152 Encounter for screening for COVID-19: Secondary | ICD-10-CM | POA: Insufficient documentation

## 2022-01-22 DIAGNOSIS — R051 Acute cough: Secondary | ICD-10-CM

## 2022-01-22 NOTE — ED Provider Notes (Signed)
[facility]    CSN: [id number] Arrival date & time: 01/22/22  1503      History   Chief Complaint Chief Complaint  Patient presents with   Cough    HPI Timothy Gutierrez. is a 71 y.o. male presents to UC today with complaint of a cough.  He reports cough started this morning.  He denies headache, runny nose, nasal congestion, ear pain, sore throat, shortness of breath, chest pain, nausea, vomiting or diarrhea.  He denies fever, chills or body aches.  He has not taken anything OTC for symptoms.  He would like a COVID test.  HPI  Past Medical History:  Diagnosis Date   Hypertension    Obesity     There are no problems to display for this patient.   History reviewed. No pertinent surgical history.     Home Medications    Prior to Admission medications   Medication Sig Start Date End Date Taking? Authorizing Provider  amLODipine (NORVASC) 10 MG tablet  06/26/18  Yes [provider]  cephALEXin (KEFLEX) 500 MG capsule Take 1 capsule (500 mg total) by mouth 2 (two) times daily. 12/07/19  Yes Cook, Jayce G, DO  hydrochlorothiazide (HYDRODIURIL) 25 MG tablet Take 25 mg by mouth daily.   Yes [provider]    Family History History reviewed. No pertinent family history.  Social History Social History   Tobacco Use   Smoking status: Former   Smokeless tobacco: Never  Building services engineer Use: Never used  Substance Use Topics   Alcohol use: No   Drug use: No     Allergies   Patient has no known allergies.   Review of Systems Review of Systems  Constitutional:  Negative for chills and fever.  HENT:  Negative for congestion, ear pain, rhinorrhea and sore throat.   Eyes:  Negative for pain and redness.  Respiratory:  Positive for cough. Negative for chest tightness and shortness of breath.   Cardiovascular:  Negative for chest pain.  Gastrointestinal:  Negative for diarrhea, nausea and vomiting.  Musculoskeletal:  Negative for  arthralgias.  Neurological:  Negative for dizziness, weakness, light-headedness and headaches.     Physical Exam Triage Vital Signs ED Triage Vitals  Enc Vitals Group     BP 01/22/22 1600 127/74     Pulse Rate 01/22/22 1600 73     Resp 01/22/22 1600 18     Temp 01/22/22 1600 98.7 F (37.1 C)     Temp Source 01/22/22 1600 Oral     SpO2 01/22/22 1600 94 %     Weight 01/22/22 1559 (!) 370 lb (167.8 kg)     Height 01/22/22 1559 6\' 1"  (1.854 m)     Head Circumference --      Peak Flow --      Pain Score 01/22/22 1559 0     Pain Loc --      Pain Edu? --      Excl. in GC? --    No data found.  Updated Vital Signs BP 127/74 (BP Location: Left Arm)   Pulse 73   Temp 98.7 F (37.1 C) (Oral)   Resp 18   Ht 6\' 1"  (1.854 m)   Wt (!) 370 lb (167.8 kg)   SpO2 94%   BMI 48.82 kg/m       Physical Exam Constitutional:      Appearance: He is obese. He is not ill-appearing.  HENT:  Head: Normocephalic.     Comments: No sinus tenderness noted    Nose: No congestion or rhinorrhea.     Mouth/Throat:     Mouth: Mucous membranes are moist.     Pharynx: Oropharynx is clear. No oropharyngeal exudate or posterior oropharyngeal erythema.  Eyes:     Extraocular Movements: Extraocular movements intact.     Conjunctiva/sclera: Conjunctivae normal.     Pupils: Pupils are equal, round, and reactive to light.  Cardiovascular:     Rate and Rhythm: Normal rate and regular rhythm.  Pulmonary:     Effort: Pulmonary effort is normal.     Breath sounds: Normal breath sounds. No wheezing, rhonchi or rales.  Lymphadenopathy:     Cervical: No cervical adenopathy.  Skin:    General: Skin is warm and dry.     Findings: No rash.  Neurological:     Mental Status: He is alert and oriented to person, place, and time.      UC Treatments / Results  Labs (all labs ordered are listed, but only abnormal results are displayed) Labs Reviewed  SARS CORONAVIRUS 2 (TAT 6-24 HRS)    Medications  Ordered in UC Medications - No data to display  Initial Impression / Assessment and Plan / UC Course  I have reviewed the triage vital signs and the nursing notes.  Pertinent labs & imaging results that were available during my care of the patient were reviewed by me and considered in my medical decision making (see chart for details).     71 year old man with a 1 day history of cough.  Exam is completely benign, lungs are clear.  No indication for chest x-ray.  Will send off COVID test and call him if the results are positive tomorrow.  He has Coricidin HBP that he will take twice daily until he gets his COVID results.  Recommend rest and fluids.  Final Clinical Impressions(s) / UC Diagnoses   Final diagnoses:  Acute cough     Discharge Instructions      We have sent off a COVID test for you today.  You will be called only if there is a positive result.  I recommend you continue your Coricidin HBP OTC.  I encourage rest and fluids.     ED Prescriptions   None    PDMP not reviewed this encounter.   Jearld Fenton, NP 01/22/22 1610

## 2022-01-22 NOTE — Discharge Instructions (Signed)
We have sent off a COVID test for you today.  You will be called only if there is a positive result.  I recommend you continue your Coricidin HBP OTC.  I encourage rest and fluids.

## 2022-01-22 NOTE — ED Triage Notes (Signed)
Pt c/o cough, sore throat x1day  Pt states that he was sick from Dec 1st - 15th and symptoms left until now.  Pt asks for a covid test.

## 2022-01-24 LAB — SARS CORONAVIRUS 2 (TAT 6-24 HRS): SARS Coronavirus 2: NEGATIVE

## 2022-10-28 ENCOUNTER — Ambulatory Visit
Admission: RE | Admit: 2022-10-28 | Discharge: 2022-10-28 | Disposition: A | Discharge status: Home / Self Care | Payer: BC Managed Care – PPO | Source: Ambulatory Visit | Attending: Family Medicine | Admitting: Family Medicine

## 2022-10-28 ENCOUNTER — Ambulatory Visit: Payer: Self-pay

## 2022-10-28 VITALS — BP 133/85 | HR 71 | Temp 98.7°F | Resp 16 | Ht 73.0 in | Wt 369.9 lb

## 2022-10-28 DIAGNOSIS — J069 Acute upper respiratory infection, unspecified: Secondary | ICD-10-CM | POA: Diagnosis not present

## 2022-10-28 LAB — SARS CORONAVIRUS 2 BY RT PCR: SARS Coronavirus 2 by RT PCR: NEGATIVE

## 2022-10-28 LAB — GROUP A STREP BY PCR: Group A Strep by PCR: NOT DETECTED

## 2022-10-28 NOTE — Discharge Instructions (Signed)
Your strep test is negative.  Your COVID test is negative.  At this point of time I do not see any signs of bacterial infection.  Watchful observation.  Follow-up in 48 to 72 hours if symptoms persist.

## 2022-10-28 NOTE — ED Triage Notes (Signed)
Patient c/o sore throat, headache, runny nose, cough, and bodyaches that started yesterday.  Patient denies fevers.

## 2022-10-28 NOTE — ED Provider Notes (Signed)
[facility]    CSN: [id number] Arrival date & time: 10/28/22  1338      History   Chief Complaint Chief Complaint  Patient presents with   Sore Throat   Headache    HPI Timothy Gutierrez. is a 72 y.o. male.   Scratchy throat, throat burning, sore throat, nasal congestion, phlegm production, chills.  No fever.  Sudden onset.  Symptoms started yesterday.  After drinking hot tea taking Aleve and resting patient is feeling slightly better today.  Patient is currently on antibiotics for chronic recurrent UTI.  Patient is taking Bactrim twice a day for last 7 to 10 days and he is supposed to take it for 30 days in total.  Denies any shortness of breath or chest pain.   Sore Throat Associated symptoms include headaches.  Headache   Past Medical History:  Diagnosis Date   Hypertension    Obesity     There are no problems to display for this patient.   History reviewed. No pertinent surgical history.     Home Medications    Prior to Admission medications   Medication Sig Start Date End Date Taking? Authorizing Provider  sulfamethoxazole-trimethoprim (BACTRIM DS) 800-160 MG tablet Take 1 tablet by mouth once. 10/18/22  Yes [provider]  amLODipine (NORVASC) 10 MG tablet  06/26/18   [provider]  cephALEXin (KEFLEX) 500 MG capsule Take 1 capsule (500 mg total) by mouth 2 (two) times daily. 12/07/19   Tommie Sams, DO  hydrochlorothiazide (HYDRODIURIL) 25 MG tablet Take 25 mg by mouth daily.    [provider]    Family History History reviewed. No pertinent family history.  Social History Social History   Tobacco Use   Smoking status: Former   Smokeless tobacco: Never  Advertising account planner   Vaping status: Never Used  Substance Use Topics   Alcohol use: No   Drug use: No     Allergies   Lisinopril   Review of Systems Review of Systems  Neurological:  Positive for headaches.  Negative other than stated in  HPI.   Physical Exam Triage Vital Signs ED Triage Vitals  Encounter Vitals Group     BP 10/28/22 1349 (!) 150/81     Systolic BP Percentile --      Diastolic BP Percentile --      Pulse Rate 10/28/22 1349 71     Resp 10/28/22 1349 16     Temp 10/28/22 1349 98.7 F (37.1 C)     Temp Source 10/28/22 1349 Oral     SpO2 10/28/22 1349 93 %     Weight 10/28/22 1347 (!) 369 lb 14.9 oz (167.8 kg)     Height 10/28/22 1347 6\' 1"  (1.854 m)     Head Circumference --      Peak Flow --      Pain Score 10/28/22 1347 2     Pain Loc --      Pain Education --      Exclude from Growth Chart --    No data found.  Updated Vital Signs BP 133/85 (BP Location: Right Arm)   Pulse 71   Temp 98.7 F (37.1 C) (Oral)   Resp 16   Ht 6\' 1"  (1.854 m)   Wt (!) 167.8 kg   SpO2 93%   BMI 48.81 kg/m   Visual Acuity Right Eye Distance:   Left Eye Distance:   Bilateral Distance:    Right Eye Near:  Left Eye Near:    Bilateral Near:     Physical Exam Alert awake oriented to time place person no acute distress.  Head is atraumatic normocephalic.  Mild pharyngeal erythema with postnasal drainage noted.  Neck is supple.  No lymphadenopathy noted.  Boggy nasal mucosa noted.  No maxillary sinus tenderness noted.  S1-S2 heard no murmur no gallop.  Bilateral air entry present no wheezing or rhonchi.  UC Treatments / Results  Labs (all labs ordered are listed, but only abnormal results are displayed) Labs Reviewed  GROUP A STREP BY PCR  SARS CORONAVIRUS 2 BY RT PCR    EKG   Radiology No results found.  Procedures Procedures (including critical care time)  Medications Ordered in UC Medications - No data to display  Initial Impression / Assessment and Plan / UC Course  I have reviewed the triage vital signs and the nursing notes.  Pertinent labs & imaging results that were available during my care of the patient were reviewed by me and considered in my medical decision making (see chart for  details).  Clinical Course as of 10/28/22 1443  Fri Oct 28, 2022  1415 SARS Coronavirus 2 by RT PCR (hospital order, performed in [facility] hospital lab) *cepheid single result test* Anterior Nasal Swab [VJ]    Clinical Course User Index [VJ] Lura Em, MD     Final Clinical Impressions(s) / UC Diagnoses   Final diagnoses:  Viral URI     Discharge Instructions      Your strep test is negative.  Your COVID test is negative.  At this point of time I do not see any signs of bacterial infection.  Watchful observation.  Follow-up in 48 to 72 hours if symptoms persist.     ED Prescriptions   None    PDMP not reviewed this encounter.   Lura Em, MD 10/28/22 [phone]

## 2022-12-04 ENCOUNTER — Ambulatory Visit
Admission: RE | Admit: 2022-12-04 | Discharge: 2022-12-04 | Disposition: A | Discharge status: Home / Self Care | Payer: No Typology Code available for payment source | Source: Ambulatory Visit | Attending: Emergency Medicine | Admitting: Emergency Medicine

## 2022-12-04 VITALS — BP 128/79 | HR 72 | Temp 97.8°F | Resp 16 | Ht 73.0 in | Wt 369.9 lb

## 2022-12-04 DIAGNOSIS — N390 Urinary tract infection, site not specified: Secondary | ICD-10-CM | POA: Diagnosis not present

## 2022-12-04 LAB — URINALYSIS, W/ REFLEX TO CULTURE (INFECTION SUSPECTED)
Bilirubin Urine: NEGATIVE
Glucose, UA: NEGATIVE mg/dL
Hgb urine dipstick: NEGATIVE
Ketones, ur: NEGATIVE mg/dL
Nitrite: POSITIVE — AB
Protein, ur: NEGATIVE mg/dL
Specific Gravity, Urine: 1.025 (ref 1.005–1.030)
WBC, UA: >50 WBC/hpf (ref 0–5)
pH: 5.5 (ref 5.0–8.0)

## 2022-12-04 MED ORDER — PHENAZOPYRIDINE HCL 200 MG PO TABS: 200.0000 mg | ORAL_TABLET | Freq: Three times a day (TID) | ORAL | 0 refills | Status: AC

## 2022-12-04 MED ORDER — SULFAMETHOXAZOLE-TRIMETHOPRIM 800-160 MG PO TABS: 1.0000 | ORAL_TABLET | Freq: Two times a day (BID) | ORAL | 0 refills | Status: AC

## 2022-12-04 NOTE — ED Triage Notes (Signed)
Patient c/o urinary frequency and dysuria that started couple days ago.

## 2022-12-04 NOTE — Discharge Instructions (Addendum)
Take the Bactrim twice daily with a full dose water for 10 days for treatment of urinary tract infection.  Use the Pyridium every 8 hours as needed for urinary discomfort, urgency, and frequency.  Increase your oral fluid intake so that you increase your urine production and help flush your urinary tract.  We are sending your urine for culture and we will make adjustments to antibiotic therapy if necessary based upon the culture results.  You need to make an appointment and follow-up with your provider at the [facility] who has been treating your recurrent urinary tract infections for further management and evaluation as to why you are having recurrent UTI.

## 2022-12-04 NOTE — ED Provider Notes (Signed)
[facility]    CSN: [id number] Arrival date & time: 12/04/22  8657      History   Chief Complaint Chief Complaint  Patient presents with   Urinary Frequency    Appointment    HPI Timothy Gutierrez. is a 72 y.o. male.   HPI  72 year old male with a past medical history of hypertension and obesity presents for evaluation of urinary urgency and frequency that started yesterday.  He is also had an increase in nocturia.  He reports he does have an enlarged prostate and typically goes 3 times at night but last night he was up every hour.  He denies any burning with urination but he states there is a "sensation" when he urinates.  No hematuria or back pain.  Also no fever.  He has had a series of urinary tract infections and typically gets treated by the [place].  His most recent infection was on September 18 and the culture grew out greater than 100,000 CFU's of E. coli.  He was placed on a 30-day course of Bactrim which she reports resolved his symptoms.  He is uncircumcised.  Past Medical History:  Diagnosis Date   Hypertension    Obesity     There are no problems to display for this patient.   History reviewed. No pertinent surgical history.     Home Medications    Prior to Admission medications   Medication Sig Start Date End Date Taking? Authorizing Provider  phenazopyridine (PYRIDIUM) 200 MG tablet Take 1 tablet (200 mg total) by mouth 3 (three) times daily. 12/04/22  Yes [name], NP  sulfamethoxazole-trimethoprim (BACTRIM DS) 800-160 MG tablet Take 1 tablet by mouth 2 (two) times daily for 10 days. 12/04/22 12/14/22 Yes [name], NP  amLODipine (NORVASC) 10 MG tablet  06/26/18   [provider]  hydrochlorothiazide (HYDRODIURIL) 25 MG tablet Take 25 mg by mouth daily.    [provider]    Family History History reviewed. No pertinent family history.  Social History Social History   Tobacco Use   Smoking status: Former    Smokeless tobacco: Never  Advertising account planner   Vaping status: Never Used  Substance Use Topics   Alcohol use: No   Drug use: No     Allergies   Lisinopril   Review of Systems Review of Systems  Constitutional:  Negative for fever.  Genitourinary:  Positive for frequency and urgency. Negative for hematuria.     Physical Exam Triage Vital Signs ED Triage Vitals  Encounter Vitals Group     BP      Systolic BP Percentile      Diastolic BP Percentile      Pulse      Resp      Temp      Temp src      SpO2      Weight      Height      Head Circumference      Peak Flow      Pain Score      Pain Loc      Pain Education      Exclude from Growth Chart    No data found.  Updated Vital Signs BP (!) 183/98 (BP Location: Right Arm)   Pulse 72   Temp 97.8 F (36.6 C) (Oral)   Resp 16   Ht 6\' 1"  (1.854 m)   Wt (!) 369 lb 14.9 oz (167.8 kg)   SpO2 93%  BMI 48.81 kg/m   Visual Acuity Right Eye Distance:   Left Eye Distance:   Bilateral Distance:    Right Eye Near:   Left Eye Near:    Bilateral Near:     Physical Exam Vitals and nursing note reviewed.  Constitutional:      Appearance: Normal appearance. He is not ill-appearing.  HENT:     Head: Normocephalic and atraumatic.  Abdominal:     Tenderness: There is no right CVA tenderness or left CVA tenderness.  Skin:    Capillary Refill: Capillary refill takes less than 2 seconds.  Neurological:     General: No focal deficit present.     Mental Status: He is alert and oriented to person, place, and time.      UC Treatments / Results  Labs (all labs ordered are listed, but only abnormal results are displayed) Labs Reviewed  URINALYSIS, W/ REFLEX TO CULTURE (INFECTION SUSPECTED) - Abnormal; Notable for the following components:      Result Value   APPearance HAZY (*)    Nitrite POSITIVE (*)    Leukocytes,Ua LARGE (*)    Bacteria, UA MANY (*)    All other components within normal limits  URINE CULTURE     EKG   Radiology No results found.  Procedures Procedures (including critical care time)  Medications Ordered in UC Medications - No data to display  Initial Impression / Assessment and Plan / UC Course  I have reviewed the triage vital signs and the nursing notes.  Pertinent labs & imaging results that were available during my care of the patient were reviewed by me and considered in my medical decision making (see chart for details).   Patient is a nontoxic-appearing 72 year old male presenting for evaluation of 2 days worth of urinary symptoms as outlined HPI above.  He has been experiencing recurrent urinary tract infections and his most recent culture in September grew out E. coli.  He was treated with a 30-day course of Bactrim at that time and reports that his symptoms resolved.  He is gone approximately 1 month and now has return of symptoms.  He reports that he is uncircumcised but he does perform genital hygiene to include cleaning his foreskin and washing the head of his penis prior to urination and after urination.  He has no CVA tenderness on exam.  I will order urinalysis to evaluate for the presence of UTI.  Urinalysis shows large leukocyte Estrace and is positive for nitrates.  The appearance is hazy.  Negative for protein, ketones, and hemoglobin.  Reflex microscopy shows greater than 50 WBCs with many bacteria and hyaline casts.  Urine will reflex to culture.  I will discharge patient home with a diagnosis of urinary tract infection and start him back on Bactrim twice daily for 10 days.  [facility] and further evaluation of his recurrent UTIs.   Final Clinical Impressions(s) / UC Diagnoses   Final diagnoses:  Lower urinary tract infectious disease     Discharge Instructions      Take the Bactrim twice daily with a full dose water for 10 days for treatment of urinary tract  infection.  Use the Pyridium every 8 hours as needed for urinary discomfort, urgency, and frequency.  Increase your oral fluid intake so that you increase your urine production and help flush your urinary tract.  We are sending your urine for culture and  we will make adjustments to antibiotic therapy if necessary based upon the culture results.  You need to make an appointment and follow-up with your provider at the [facility] who has been treating your recurrent urinary tract infections for further management and evaluation as to why you are having recurrent UTI.     ED Prescriptions     Medication Sig Dispense Auth. Provider   sulfamethoxazole-trimethoprim (BACTRIM DS) 800-160 MG tablet Take 1 tablet by mouth 2 (two) times daily for 10 days. 20 tablet [name], NP   phenazopyridine (PYRIDIUM) 200 MG tablet Take 1 tablet (200 mg total) by mouth 3 (three) times daily. 6 tablet [name], NP      PDMP not reviewed this encounter.   [name], NP 12/04/22 1001

## 2022-12-06 LAB — URINE CULTURE: Culture: >=100000 — AB

## 2022-12-19 ENCOUNTER — Ambulatory Visit
Admission: EM | Admit: 2022-12-19 | Discharge: 2022-12-19 | Disposition: A | Discharge status: Home / Self Care | Payer: No Typology Code available for payment source | Attending: Emergency Medicine | Admitting: Emergency Medicine

## 2022-12-19 DIAGNOSIS — R319 Hematuria, unspecified: Secondary | ICD-10-CM | POA: Insufficient documentation

## 2022-12-19 LAB — URINALYSIS, W/ REFLEX TO CULTURE (INFECTION SUSPECTED)
Bacteria, UA: NONE SEEN
Bilirubin Urine: NEGATIVE
Glucose, UA: NEGATIVE mg/dL
Hgb urine dipstick: NEGATIVE
Ketones, ur: NEGATIVE mg/dL
Leukocytes,Ua: NEGATIVE
Nitrite: NEGATIVE
Protein, ur: NEGATIVE mg/dL
Specific Gravity, Urine: 1.025 (ref 1.005–1.030)
pH: 5.5 (ref 5.0–8.0)

## 2022-12-19 NOTE — Discharge Instructions (Signed)
Urinalysis is negative for any signs of infection and there is at the time no blood in the sample given  If you begin to have continued blood in the urine please reach out to your primary doctor for reevaluation  If you begin to have any new symptoms such as frequency or urgency or discomfort with urination you may follow-up with his urgent care and we will recheck your urine at that time

## 2022-12-19 NOTE — ED Triage Notes (Signed)
Pt presents to UC for recheck on urine from 11/3 due to having UTI. States had 1 episode of hematuria on 11/11.

## 2022-12-19 NOTE — ED Provider Notes (Signed)
[facility]    CSN: [id number] Arrival date & time: 12/19/22  0846      History   Chief Complaint Chief Complaint  Patient presents with   Hematuria    HPI Timothy Gutierrez. is a 72 y.o. male.   Patient presents for evaluation after 1 occurrence of hematuria on 12/12/2022.  Endorses that he completed course of antibiotics on 12/13/2022 for diagnosed UTI on 12/31/2022.  Endorses he has been having reoccurring urinary infections since August.  Has upcoming appointment with urology in January 2025.  Wanting to ensure that all infection has cleared as he has become septic in the past.     Past Medical History:  Diagnosis Date   Hypertension    Obesity     There are no problems to display for this patient.   History reviewed. No pertinent surgical history.     Home Medications    Prior to Admission medications   Medication Sig Start Date End Date Taking? Authorizing Provider  amLODipine (NORVASC) 10 MG tablet  06/26/18  Yes [provider]  hydrochlorothiazide (HYDRODIURIL) 25 MG tablet Take 25 mg by mouth daily.   Yes [provider]  phenazopyridine (PYRIDIUM) 200 MG tablet Take 1 tablet (200 mg total) by mouth 3 (three) times daily. 12/04/22   [name], NP    Family History History reviewed. No pertinent family history.  Social History Social History   Tobacco Use   Smoking status: Former   Smokeless tobacco: Never  Advertising account planner   Vaping status: Never Used  Substance Use Topics   Alcohol use: No   Drug use: No     Allergies   Lisinopril   Review of Systems Review of Systems  Genitourinary:  Positive for hematuria. Negative for decreased urine volume, difficulty urinating, dysuria, enuresis, flank pain, frequency, genital sores, penile discharge, penile pain, penile swelling, scrotal swelling, testicular pain and urgency.     Physical Exam Triage Vital Signs ED Triage Vitals  Encounter Vitals Group     BP  12/19/22 0857 (!) 142/73     Systolic BP Percentile --      Diastolic BP Percentile --      Pulse Rate 12/19/22 0857 60     Resp 12/19/22 0857 16     Temp 12/19/22 0857 98.4 F (36.9 C)     Temp Source 12/19/22 0857 Oral     SpO2 12/19/22 0857 95 %     Weight 12/19/22 0856 (!) 369 lb 14.9 oz (167.8 kg)     Height 12/19/22 0856 6\' 1"  (1.854 m)     Head Circumference --      Peak Flow --      Pain Score 12/19/22 0904 0     Pain Loc --      Pain Education --      Exclude from Growth Chart --    No data found.  Updated Vital Signs BP (!) 142/73 (BP Location: Left Arm)   Pulse 60   Temp 98.4 F (36.9 C) (Oral)   Resp 16   Ht 6\' 1"  (1.854 m)   Wt (!) 369 lb 14.9 oz (167.8 kg)   SpO2 95%   BMI 48.81 kg/m   Visual Acuity Right Eye Distance:   Left Eye Distance:   Bilateral Distance:    Right Eye Near:   Left Eye Near:    Bilateral Near:     Physical Exam Constitutional:      Appearance: Normal  appearance.  Eyes:     Extraocular Movements: Extraocular movements intact.  Pulmonary:     Effort: Pulmonary effort is normal.  Genitourinary:    Comments: deferred Neurological:     Mental Status: He is alert and oriented to person, place, and time. Mental status is at baseline.      UC Treatments / Results  Labs (all labs ordered are listed, but only abnormal results are displayed) Labs Reviewed  URINALYSIS, W/ REFLEX TO CULTURE (INFECTION SUSPECTED)    EKG   Radiology No results found.  Procedures Procedures (including critical care time)  Medications Ordered in UC Medications - No data to display  Initial Impression / Assessment and Plan / UC Course  I have reviewed the triage vital signs and the nursing notes.  Pertinent labs & imaging results that were available during my care of the patient were reviewed by me and considered in my medical decision making (see chart for details).  Hematuria  Urinalysis negative, sent for culture, discussed with  patient, recommended to monitor and to follow-up for any new or reoccurring symptoms.  Strongly advised keeping upcoming appointment with urologist in January for further management and evaluation Final Clinical Impressions(s) / UC Diagnoses   Final diagnoses:  Hematuria, unspecified type   Discharge Instructions   None    ED Prescriptions   None    PDMP not reviewed this encounter.   Valinda Hoar, [place] 12/19/22 [phone]

## 2023-01-03 ENCOUNTER — Ambulatory Visit
Admission: EM | Admit: 2023-01-03 | Discharge: 2023-01-03 | Disposition: A | Discharge status: Home / Self Care | Payer: No Typology Code available for payment source | Attending: Physician Assistant | Admitting: Physician Assistant

## 2023-01-03 DIAGNOSIS — N3000 Acute cystitis without hematuria: Secondary | ICD-10-CM | POA: Diagnosis present

## 2023-01-03 DIAGNOSIS — R300 Dysuria: Secondary | ICD-10-CM | POA: Diagnosis present

## 2023-01-03 LAB — URINALYSIS, W/ REFLEX TO CULTURE (INFECTION SUSPECTED)
Glucose, UA: NEGATIVE mg/dL
Hgb urine dipstick: NEGATIVE
Nitrite: NEGATIVE
Protein, ur: NEGATIVE mg/dL
Specific Gravity, Urine: 1.025 (ref 1.005–1.030)
pH: 6 (ref 5.0–8.0)

## 2023-01-03 MED ORDER — SULFAMETHOXAZOLE-TRIMETHOPRIM 800-160 MG PO TABS: 1.0000 | ORAL_TABLET | Freq: Two times a day (BID) | ORAL | 0 refills | Status: AC

## 2023-01-03 NOTE — ED Triage Notes (Signed)
Pt c/o urinary freq & pain x3 days. Denies any hematuria.

## 2023-01-03 NOTE — ED Provider Notes (Signed)
[facility]    CSN: [id number] Arrival date & time: 01/03/23  7829      History   Chief Complaint Chief Complaint  Patient presents with   Dysuria    HPI Timothy Gutierrez. is a 72 y.o. male.   Patient presents for 2 day history of urinary frequency and lower back aching.   Patient says he has had a few UTIs in the last couple of months. Seen here on 11/3 and given Bactrim Ds x 10 days. He did have urine re-checked on 11/18 and it was clear. He says he was given Bactrim DS at the [place] back in September and says he took it for 30 days.   Denies fever, fatigue, hematuria, nausea/vomiting, sweats, urethral discharge, abdominal more testicular pain.  Has an appointment to see urologist in January to have his prostate checked and see why he is having frequent UTIs.  HPI  Past Medical History:  Diagnosis Date   Hypertension    Obesity     There are no problems to display for this patient.   History reviewed. No pertinent surgical history.     Home Medications    Prior to Admission medications   Medication Sig Start Date End Date Taking? Authorizing Provider  amLODipine (NORVASC) 10 MG tablet  06/26/18  Yes [provider]  hydrochlorothiazide (HYDRODIURIL) 25 MG tablet Take 25 mg by mouth daily.   Yes [provider]  sulfamethoxazole-trimethoprim (BACTRIM DS) 800-160 MG tablet Take 1 tablet by mouth 2 (two) times daily for 14 days. 01/03/23 01/17/23 Yes Shirlee Latch, PA-C  phenazopyridine (PYRIDIUM) 200 MG tablet Take 1 tablet (200 mg total) by mouth 3 (three) times daily. 12/04/22   [name], NP    Family History History reviewed. No pertinent family history.  Social History Social History   Tobacco Use   Smoking status: Former   Smokeless tobacco: Never  Advertising account planner   Vaping status: Never Used  Substance Use Topics   Alcohol use: No   Drug use: No     Allergies   Lisinopril   Review of Systems Review of Systems   Constitutional:  Negative for fatigue and fever.  Gastrointestinal:  Negative for abdominal pain, nausea and vomiting.  Genitourinary:  Positive for frequency and urgency. Negative for dysuria, genital sores, hematuria, penile discharge, penile pain, penile swelling, scrotal swelling and testicular pain.  Musculoskeletal:  Positive for back pain. Negative for arthralgias.  Skin:  Negative for rash.  Neurological:  Negative for weakness.     Physical Exam Triage Vital Signs ED Triage Vitals  Encounter Vitals Group     BP      Systolic BP Percentile      Diastolic BP Percentile      Pulse      Resp      Temp      Temp src      SpO2      Weight      Height      Head Circumference      Peak Flow      Pain Score      Pain Loc      Pain Education      Exclude from Growth Chart    No data found.  Updated Vital Signs BP (!) 151/80 (BP Location: Left Arm)   Pulse 66   Temp 97.7 F (36.5 C) (Oral)   Resp 16   Ht 6\' 1"  (1.854 m)  Wt (!) 369 lb 14.9 oz (167.8 kg)   SpO2 95%   BMI 48.81 kg/m   Physical Exam Vitals and nursing note reviewed.  Constitutional:      General: He is not in acute distress.    Appearance: Normal appearance. He is well-developed. He is obese. He is not ill-appearing.  HENT:     Head: Normocephalic and atraumatic.  Eyes:     General: No scleral icterus.    Conjunctiva/sclera: Conjunctivae normal.  Cardiovascular:     Rate and Rhythm: Normal rate and regular rhythm.     Heart sounds: Normal heart sounds.  Pulmonary:     Effort: Pulmonary effort is normal. No respiratory distress.     Breath sounds: Normal breath sounds.  Abdominal:     Palpations: Abdomen is soft.     Tenderness: There is no abdominal tenderness. There is no right CVA tenderness or left CVA tenderness.  Musculoskeletal:     Cervical back: Neck supple.  Skin:    General: Skin is warm and dry.     Capillary Refill: Capillary refill takes less than 2 seconds.   Neurological:     General: No focal deficit present.     Mental Status: He is alert. Mental status is at baseline.     Motor: No weakness.     Gait: Gait normal.  Psychiatric:        Mood and Affect: Mood normal.        Behavior: Behavior normal.      UC Treatments / Results  Labs (all labs ordered are listed, but only abnormal results are displayed) Labs Reviewed  URINALYSIS, W/ REFLEX TO CULTURE (INFECTION SUSPECTED) - Abnormal; Notable for the following components:      Result Value   APPearance HAZY (*)    Bilirubin Urine SMALL (*)    Ketones, ur TRACE (*)    Leukocytes,Ua SMALL (*)    Bacteria, UA FEW (*)    All other components within normal limits  URINE CULTURE    EKG   Radiology No results found.  Procedures Procedures (including critical care time)  Medications Ordered in UC Medications - No data to display  Initial Impression / Assessment and Plan / UC Course  I have reviewed the triage vital signs and the nursing notes.  Pertinent labs & imaging results that were available during my care of the patient were reviewed by me and considered in my medical decision making (see chart for details).   72 year old male with history of recurrent UTIs presents for frequency, urgency and lower back aching x 2 days.  Denies fever, significant pain with urination, flank pain, hematuria, discharge or testicular pain.  He is afebrile and overall well-appearing.  No CVA tenderness or abdominal tenderness.  Chest clear.  UA shows hazy urine with small bili, trace ketones, small leukocytes and bacteria.  Urine to be sent for culture.  Reviewed previous culture from last month which is susceptible to everything.  Advised treating him with something different than Bactrim because that is what he always receives.  He says he does not feel comfortable taking anything else and thinks he has an allergy to "the c-word antibiotic."  He wants to take Bactrim DS again.  Explained that  it might not work for him since he has taken it every time he said a UTI edema any something different.  He is adamant that he receives Bactrim DS.  Sent 14-day course of Bactrim DS as he may have  chronic prostatitis as well.  Will amend treatment based on culture if necessary.  Encouraged increasing rest and fluids.  Reviewed return precautions.  Follow-up with urology.   Final Clinical Impressions(s) / UC Diagnoses   Final diagnoses:  Acute cystitis without hematuria  Dysuria     Discharge Instructions      UTI: Based on either symptoms or urinalysis, you may have a urinary tract infection. We will send the urine for culture and call with results in a few days. Begin antibiotics at this time. Your symptoms should be much improved over the next 2-3 days. Increase rest and fluid intake. If for some reason symptoms are worsening or not improving after a couple of days or the urine culture determines the antibiotics you are taking will not treat the infection, the antibiotics may be changed. Return or go to ER for fever, back pain, worsening urinary pain, discharge, increased blood in urine. May take Tylenol or Motrin OTC for pain relief or consider AZO if no contraindications      ED Prescriptions     Medication Sig Dispense Auth. Provider   sulfamethoxazole-trimethoprim (BACTRIM DS) 800-160 MG tablet Take 1 tablet by mouth 2 (two) times daily for 14 days. 28 tablet Shirlee Latch, PA-C      PDMP not reviewed this encounter.   Shirlee Latch, PA-C 01/03/23 1015

## 2023-01-03 NOTE — Discharge Instructions (Signed)

## 2023-01-04 LAB — URINE CULTURE: Culture: <10000 — AB

## 2023-08-05 ENCOUNTER — Ambulatory Visit
Admission: EM | Admit: 2023-08-05 | Discharge: 2023-08-05 | Disposition: A | Discharge status: Home / Self Care | Attending: Emergency Medicine | Admitting: Emergency Medicine

## 2023-08-05 ENCOUNTER — Encounter: Payer: Self-pay | Admitting: Emergency Medicine

## 2023-08-05 DIAGNOSIS — N3000 Acute cystitis without hematuria: Secondary | ICD-10-CM | POA: Insufficient documentation

## 2023-08-05 DIAGNOSIS — R350 Frequency of micturition: Secondary | ICD-10-CM | POA: Insufficient documentation

## 2023-08-05 LAB — URINALYSIS, W/ REFLEX TO CULTURE (INFECTION SUSPECTED)
Bilirubin Urine: NEGATIVE
Glucose, UA: NEGATIVE mg/dL
Ketones, ur: 15 mg/dL — AB
Nitrite: POSITIVE — AB
Protein, ur: 100 mg/dL — AB
Specific Gravity, Urine: 1.025 (ref 1.005–1.030)
WBC, UA: >50 WBC/hpf (ref 0–5)
pH: 6.5 (ref 5.0–8.0)

## 2023-08-05 MED ORDER — SULFAMETHOXAZOLE-TRIMETHOPRIM 800-160 MG PO TABS: 1.0000 | ORAL_TABLET | Freq: Two times a day (BID) | ORAL | 0 refills | Status: AC

## 2023-08-05 NOTE — Discharge Instructions (Signed)

## 2023-08-05 NOTE — ED Provider Notes (Signed)
 [facility]    CSN: [id number] Arrival date & time: 08/05/23  1531      History   Chief Complaint Chief Complaint  Patient presents with   Urinary Frequency    HPI Timothy P Manoj Enriquez. is a 73 y.o. male.   Patient presents for 2 day history of urinary frequency and lower back aching.   Patient says he has had a few UTIs in the last couple of months. Seen here on 11/3 and given Bactrim  Ds x 10 days. He did have urine re-checked on 11/18 and it was clear. He says he was given Bactrim  DS at the [facility] back in September and says he took it for 30 days.   Denies fever, fatigue, hematuria, nausea/vomiting, sweats, urethral discharge, abdominal more testicular pain.  Has an appointment to see urologist in January to have his prostate checked and see why he is having frequent UTIs.  HPI  Past Medical History:  Diagnosis Date   Hypertension    Obesity     There are no active problems to display for this patient.   History reviewed. No pertinent surgical history.     Home Medications    Prior to Admission medications   Medication Sig Start Date End Date Taking? Authorizing Provider  amLODipine (NORVASC) 10 MG tablet  06/26/18   [provider]  hydrochlorothiazide (HYDRODIURIL) 25 MG tablet Take 25 mg by mouth daily.    [provider]  phenazopyridine  (PYRIDIUM ) 200 MG tablet Take 1 tablet (200 mg total) by mouth 3 (three) times daily. 12/04/22   Bernardino Ditch, NP    Family History History reviewed. No pertinent family history.  Social History Social History   Tobacco Use   Smoking status: Former   Smokeless tobacco: Never  Advertising account planner   Vaping status: Never Used  Substance Use Topics   Alcohol use: No   Drug use: No     Allergies   Lisinopril   Review of Systems Review of Systems  Constitutional:  Negative for fatigue and fever.  Gastrointestinal:  Negative for abdominal pain, nausea and vomiting.  Genitourinary:  Positive for  frequency and urgency. Negative for dysuria, genital sores, hematuria, penile discharge, penile pain, penile swelling, scrotal swelling and testicular pain.  Musculoskeletal:  Positive for back pain. Negative for arthralgias.  Skin:  Negative for rash.  Neurological:  Negative for weakness.     Physical Exam Triage Vital Signs ED Triage Vitals  Encounter Vitals Group     BP      Systolic BP Percentile      Diastolic BP Percentile      Pulse      Resp      Temp      Temp src      SpO2      Weight      Height      Head Circumference      Peak Flow      Pain Score      Pain Loc      Pain Education      Exclude from Growth Chart    No data found.  Updated Vital Signs BP 128/73 (BP Location: Left Arm)   Pulse 83   Temp 98.6 F (37 C) (Oral)   Resp 20   Ht 6' 1 (1.854 m)   Wt (!) 369 lb 14.9 oz (167.8 kg)   SpO2 92%   BMI 48.81 kg/m   Physical Exam Vitals and nursing note  reviewed.  Constitutional:      General: He is not in acute distress.    Appearance: Normal appearance. He is well-developed. He is obese. He is not ill-appearing.  HENT:     Head: Normocephalic and atraumatic.  Eyes:     General: No scleral icterus.    Conjunctiva/sclera: Conjunctivae normal.  Cardiovascular:     Rate and Rhythm: Normal rate and regular rhythm.     Heart sounds: Normal heart sounds.  Pulmonary:     Effort: Pulmonary effort is normal. No respiratory distress.     Breath sounds: Normal breath sounds.  Abdominal:     Palpations: Abdomen is soft.     Tenderness: There is no abdominal tenderness. There is no right CVA tenderness or left CVA tenderness.  Musculoskeletal:     Cervical back: Neck supple.  Skin:    General: Skin is warm and dry.     Capillary Refill: Capillary refill takes less than 2 seconds.  Neurological:     General: No focal deficit present.     Mental Status: He is alert. Mental status is at baseline.     Motor: No weakness.     Gait: Gait normal.   Psychiatric:        Mood and Affect: Mood normal.        Behavior: Behavior normal.      UC Treatments / Results  Labs (all labs ordered are listed, but only abnormal results are displayed) Labs Reviewed  URINALYSIS, W/ REFLEX TO CULTURE (INFECTION SUSPECTED)    EKG   Radiology No results found.  Procedures Procedures (including critical care time)  Medications Ordered in UC Medications - No data to display  Initial Impression / Assessment and Plan / UC Course  I have reviewed the triage vital signs and the nursing notes.  Pertinent labs & imaging results that were available during my care of the patient were reviewed by me and considered in my medical decision making (see chart for details).   73 year old male with history of recurrent UTIs presents for frequency, urgency and lower back aching x 2 days.  Denies fever, significant pain with urination, flank pain, hematuria, discharge or testicular pain.  He is afebrile and overall well-appearing.  No CVA tenderness or abdominal tenderness.  Chest clear.  UA shows hazy urine with small bili, trace ketones, small leukocytes and bacteria.  Urine to be sent for culture.  Reviewed previous culture from last month which is susceptible to everything.  Advised treating him with something different than Bactrim  because that is what he always receives.  He says he does not feel comfortable taking anything else and thinks he has an allergy to the c-word antibiotic.  He wants to take Bactrim  DS again.  Explained that it might not work for him since he has taken it every time he said a UTI edema any something different.  He is adamant that he receives Bactrim  DS.  Sent 14-day course of Bactrim  DS as he may have chronic prostatitis as well.  Will amend treatment based on culture if necessary.  Encouraged increasing rest and fluids.  Reviewed return precautions.  Follow-up with urology.   Final Clinical Impressions(s) / UC Diagnoses    Final diagnoses:  None   Discharge Instructions   None     ED Prescriptions   None    PDMP not reviewed this encounter.   Arvis Jolan NOVAK, PA-C 01/03/23 1015

## 2023-08-05 NOTE — ED Triage Notes (Signed)
 Patient states that he has an enlarged prostate.  Patient reports urinary frequency that started earlier this week.  Patient reports some discomfort when he finishes urinating.

## 2023-08-08 ENCOUNTER — Ambulatory Visit (HOSPITAL_COMMUNITY): Payer: Self-pay

## 2023-08-08 LAB — URINE CULTURE: Culture: >=100000 — AB
